# Patient Record
Sex: Male | Born: 1990 | Race: White | Hispanic: No | Marital: Single | Smoking: Heavy tobacco smoker
Health system: Southern US, Community
[De-identification: ages and names within clinical notes are randomized; demographics above are authoritative.]

---
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
## 2016-11-19 DIAGNOSIS — Y999 Unspecified external cause status: Secondary | ICD-10-CM | POA: Insufficient documentation

## 2016-11-19 DIAGNOSIS — Y9372 Activity, wrestling: Secondary | ICD-10-CM | POA: Insufficient documentation

## 2016-11-19 DIAGNOSIS — W228XXA Striking against or struck by other objects, initial encounter: Secondary | ICD-10-CM | POA: Insufficient documentation

## 2016-11-19 DIAGNOSIS — Y929 Unspecified place or not applicable: Secondary | ICD-10-CM | POA: Insufficient documentation

## 2016-11-19 DIAGNOSIS — S52002A Unspecified fracture of upper end of left ulna, initial encounter for closed fracture: Secondary | ICD-10-CM | POA: Insufficient documentation

## 2016-11-19 DIAGNOSIS — F17210 Nicotine dependence, cigarettes, uncomplicated: Secondary | ICD-10-CM | POA: Insufficient documentation

## 2016-11-20 ENCOUNTER — Encounter (HOSPITAL_COMMUNITY): Payer: Self-pay

## 2016-11-20 ENCOUNTER — Emergency Department (HOSPITAL_COMMUNITY): Payer: Self-pay

## 2016-11-20 ENCOUNTER — Emergency Department (HOSPITAL_COMMUNITY)
Admission: EM | Admit: 2016-11-20 | Discharge: 2016-11-20 | Disposition: A | Discharge status: Home / Self Care | Payer: Self-pay | Attending: Emergency Medicine | Admitting: Emergency Medicine

## 2016-11-20 DIAGNOSIS — S52002A Unspecified fracture of upper end of left ulna, initial encounter for closed fracture: Secondary | ICD-10-CM

## 2016-11-20 MED ORDER — HYDROCODONE-ACETAMINOPHEN 5-325 MG PO TABS: 1.0000 | ORAL_TABLET | ORAL | 0 refills | Status: AC | PRN

## 2016-11-20 MED ORDER — OXYCODONE-ACETAMINOPHEN 5-325 MG PO TABS
1.0000 | ORAL_TABLET | ORAL | Status: DC | PRN
  Administered 2016-11-20: 1 via ORAL

## 2016-11-20 MED ORDER — OXYCODONE-ACETAMINOPHEN 5-325 MG PO TABS
ORAL_TABLET | ORAL | Status: AC
  Filled 2016-11-20: qty 1

## 2016-11-20 NOTE — ED Triage Notes (Signed)
Pt. Was wrestling with a friend and friend slammed him on the ground and he heard his left arm pop out of joint.

## 2016-11-20 NOTE — ED Provider Notes (Signed)
[facility]-EMERGENCY DEPT Provider Note   CSN: [id number] Arrival date & time: 11/19/16  2357     History   Chief Complaint Chief Complaint  Patient presents with  . Arm Injury    HPI Dustin Brewer is a 26 y.o. male.  Patient presents for evaluation of arm injury. Patient reports that he was wrestling with a friend and was slammed to the ground. Patient heard a pop and felt immediate pain in the area of the left elbow. No other injury.      History reviewed. No pertinent past medical history.  There are no active problems to display for this patient.   History reviewed. No pertinent surgical history.     Home Medications    Prior to Admission medications   Not on File    Family History History reviewed. No pertinent family history.  Social History Social History  Substance Use Topics  . Smoking status: Heavy Tobacco Smoker    Packs/day: 1.00    Types: Cigarettes  . Smokeless tobacco: Never Used  . Alcohol use Not on file     Allergies   Patient has no allergy information on record.   Review of Systems Review of Systems  Musculoskeletal:       Arm pain  All other systems reviewed and are negative.    Physical Exam Updated Vital Signs BP 111/66 (BP Location: Right Arm)   Pulse 97   Temp 97.7 F (36.5 C) (Oral)   Ht  (1.803 m)   Wt 70.3 kg (155 lb)   SpO2 99%   BMI 21.62 kg/m   Physical Exam  Constitutional: He appears well-developed and well-nourished.  HENT:  Head: Atraumatic.  Eyes: EOM are normal.  Cardiovascular:  Pulses:      Radial pulses are 2+ on the left side.  Pulmonary/Chest: Effort normal.  Musculoskeletal:       Left forearm: He exhibits tenderness and swelling.  Skin: Skin is warm, dry and intact.     ED Treatments / Results  Labs (all labs ordered are listed, but only abnormal results are displayed) Labs Reviewed - No data to display  EKG  EKG Interpretation None       Radiology Dg Elbow 2 Views  Left  Result Date: 11/20/2016 CLINICAL DATA:  Dislocation. Left elbow pain after injury, felt a pop. EXAM: LEFT ELBOW - 2 VIEW COMPARISON:  None. FINDINGS: Proximal ulnar shaft fracture. Elbow alignment is grossly maintained. No evidence of elbow joint effusion. Soft tissue edema at the fracture site. IMPRESSION: Proximal ulnar shaft fracture. No evidence of additional fracture of the elbow. No dislocation. Electronically Signed   By: Rubye Oaks M.D.   On: 11/20/2016 02:13   Dg Forearm Left  Result Date: 11/20/2016 CLINICAL DATA:  Left forearm pain after injury. EXAM: LEFT FOREARM - 2 VIEW COMPARISON:  None. FINDINGS: Nondisplaced minimally comminuted mid proximal ulnar shaft fracture. No significant angulation. The radius is intact. There is soft tissue edema at the fracture site. Wrist alignment is maintained. IMPRESSION: Nondisplaced minimally comminuted mid proximal ulnar shaft fracture. Electronically Signed   By: Rubye Oaks M.D.   On: 11/20/2016 02:16    Procedures Procedures (including critical care time)  Medications Ordered in ED Medications  oxyCODONE-acetaminophen (PERCOCET/ROXICET) 5-325 MG per tablet 1 tablet (1 tablet Oral Given 11/20/16 0036)     Initial Impression / Assessment and Plan / ED Course  I have reviewed the triage vital signs and the nursing notes.  Pertinent labs &  imaging results that were available during my care of the patient were reviewed by me and considered in my medical decision making (see chart for details).     Patient presents to the ER for evaluation of left arm injury. X-ray shows proximal ulna fracture. No other injury noted. Patient is neurovascularly intact. Discussed with Dr. Janee Morn, will place in sugar tong splint. Dr. Janee Morn will see in the ER.  Final Clinical Impressions(s) / ED Diagnoses   Final diagnoses:  Closed fracture of proximal end of left ulna, unspecified fracture morphology, initial encounter    New  Prescriptions New Prescriptions   No medications on file     Gilda Crease, MD 11/20/16 [phone]

## 2016-11-20 NOTE — Consult Note (Signed)
ORTHOPAEDIC CONSULTATION HISTORY & PHYSICAL REQUESTING PHYSICIAN: Gilda Crease, *  Chief Complaint: left forearm pain  HPI: Dustin Brewer is a 26 y.o. male who reports horse playing with a friend, when he was thrown to the ground.  He had the onset of pain in the proximal aspect of the forearm without deformity, and presented to the emergency department for evaluation.  X-rays admit obtained and consultation made.  History reviewed. No pertinent past medical history. History reviewed. No pertinent surgical history. Social History   Social History  . Marital status: Single    Spouse name: N/A  . Number of children: N/A  . Years of education: N/A   Social History Main Topics  . Smoking status: Heavy Tobacco Smoker    Packs/day: 1.00    Types: Cigarettes  . Smokeless tobacco: Never Used  . Alcohol use None  . Drug use: Unknown  . Sexual activity: Not Asked   Other Topics Concern  . None   Social History Narrative  . None   History reviewed. No pertinent family history. Not on File Prior to Admission medications   Medication Sig Start Date End Date Taking? Authorizing Provider  HYDROcodone-acetaminophen (NORCO/VICODIN) 5-325 MG tablet Take 1 tablet by mouth every 4 (four) hours as needed for moderate pain. 11/20/16   Gilda Crease, MD   Dg Elbow 2 Views Left  Result Date: 11/20/2016 CLINICAL DATA:  Dislocation. Left elbow pain after injury, felt a pop. EXAM: LEFT ELBOW - 2 VIEW COMPARISON:  None. FINDINGS: Proximal ulnar shaft fracture. Elbow alignment is grossly maintained. No evidence of elbow joint effusion. Soft tissue edema at the fracture site. IMPRESSION: Proximal ulnar shaft fracture. No evidence of additional fracture of the elbow. No dislocation. Electronically Signed   By: Rubye Oaks M.D.   On: 11/20/2016 02:13   Dg Forearm Left  Result Date: 11/20/2016 CLINICAL DATA:  Left forearm pain after injury. EXAM: LEFT FOREARM - 2 VIEW  COMPARISON:  None. FINDINGS: Nondisplaced minimally comminuted mid proximal ulnar shaft fracture. No significant angulation. The radius is intact. There is soft tissue edema at the fracture site. Wrist alignment is maintained. IMPRESSION: Nondisplaced minimally comminuted mid proximal ulnar shaft fracture. Electronically Signed   By: Rubye Oaks M.D.   On: 11/20/2016 02:16    Positive ROS: All other systems have been reviewed and were otherwise negative with the exception of those mentioned in the HPI and as above.  Physical Exam: Vitals: Refer to EMR. Constitutional:  WD, WN, NAD HEENT:  NCAT, EOMI Neuro/Psych:  Alert & oriented to person, place, and time; appropriate mood & affect Lymphatic: No generalized extremity edema or lymphadenopathy Extremities / MSK:  The extremities are normal with respect to appearance, ranges of motion, joint stability, muscle strength/tone, sensation, & perfusion except as otherwise noted:  Left upper extremity is presently in a sugar tong splint.  Fingers are warm with brisk capillary refill, intact light touch sensibility in the radial, median, and ulnar nerve distributions with intact motor to the same.  Tenderness at the proximal one third of the forearm, particularly at the subcutaneous border of the ulna  Assessment: Relatively nondisplaced, non-angulated left ulna fracture at the junction of the middle and proximal one thirds, with what appears to be well reduced radiocapitellar joint.  Plan: I discussed these findings with him.  I really encouraged him to keep the splint in place and to engage in nothing heavier paper/pencil tasks with the left hand to help prevent displacement of the  fracture.  If it doesn't displace, his well-suited for nonoperative treatment.  We will have him back in 7-10 days, at which time he should get new x-rays of the left forearm in the splint.  Cliffton Asters Janee Morn, MD      Orthopaedic & Hand Surgery [facility] Orthopaedic &  Sports Medicine [facility] [address] [place], [place]  [address] Office: [phone] Mobile: [phone]  11/20/2016, 6:15 AM

## 2018-06-02 IMAGING — DX DG ELBOW 2V*L*
3 series · 3 of 3 positions shown · non-contrast
Comparison: None.

CLINICAL DATA: Dislocation. Left elbow pain after injury, felt a
pop.

EXAM:
LEFT ELBOW - 2 VIEW

[elbow ap (1 of 2)]
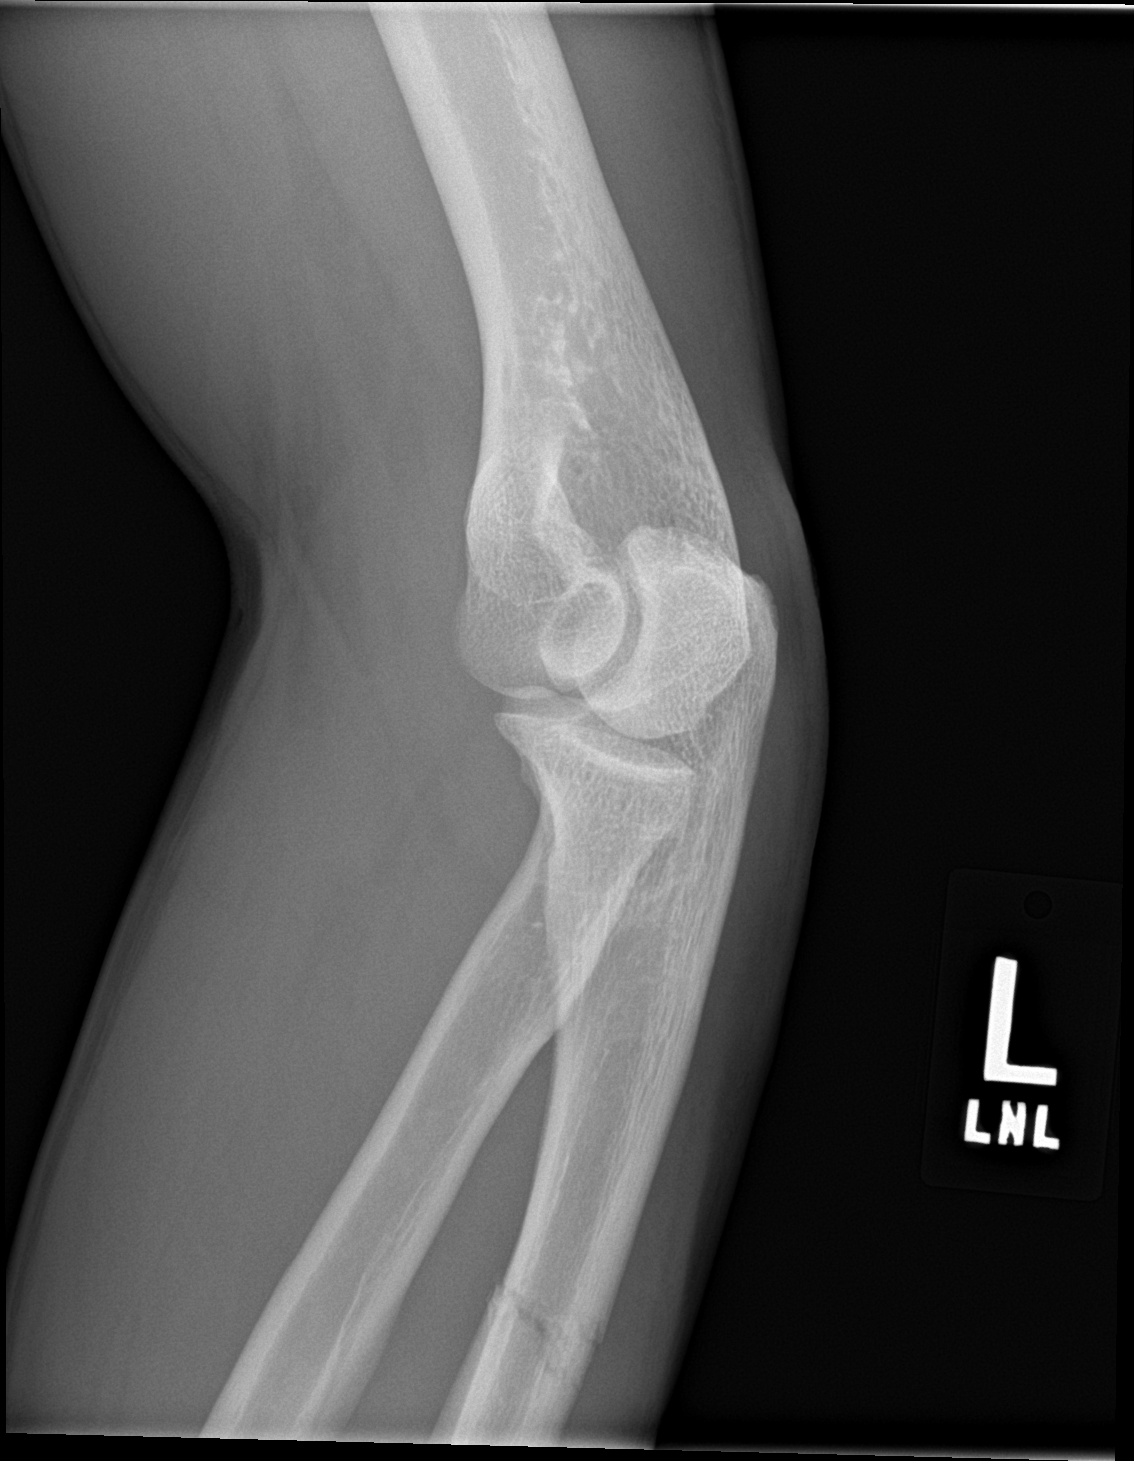

[elbow lat]
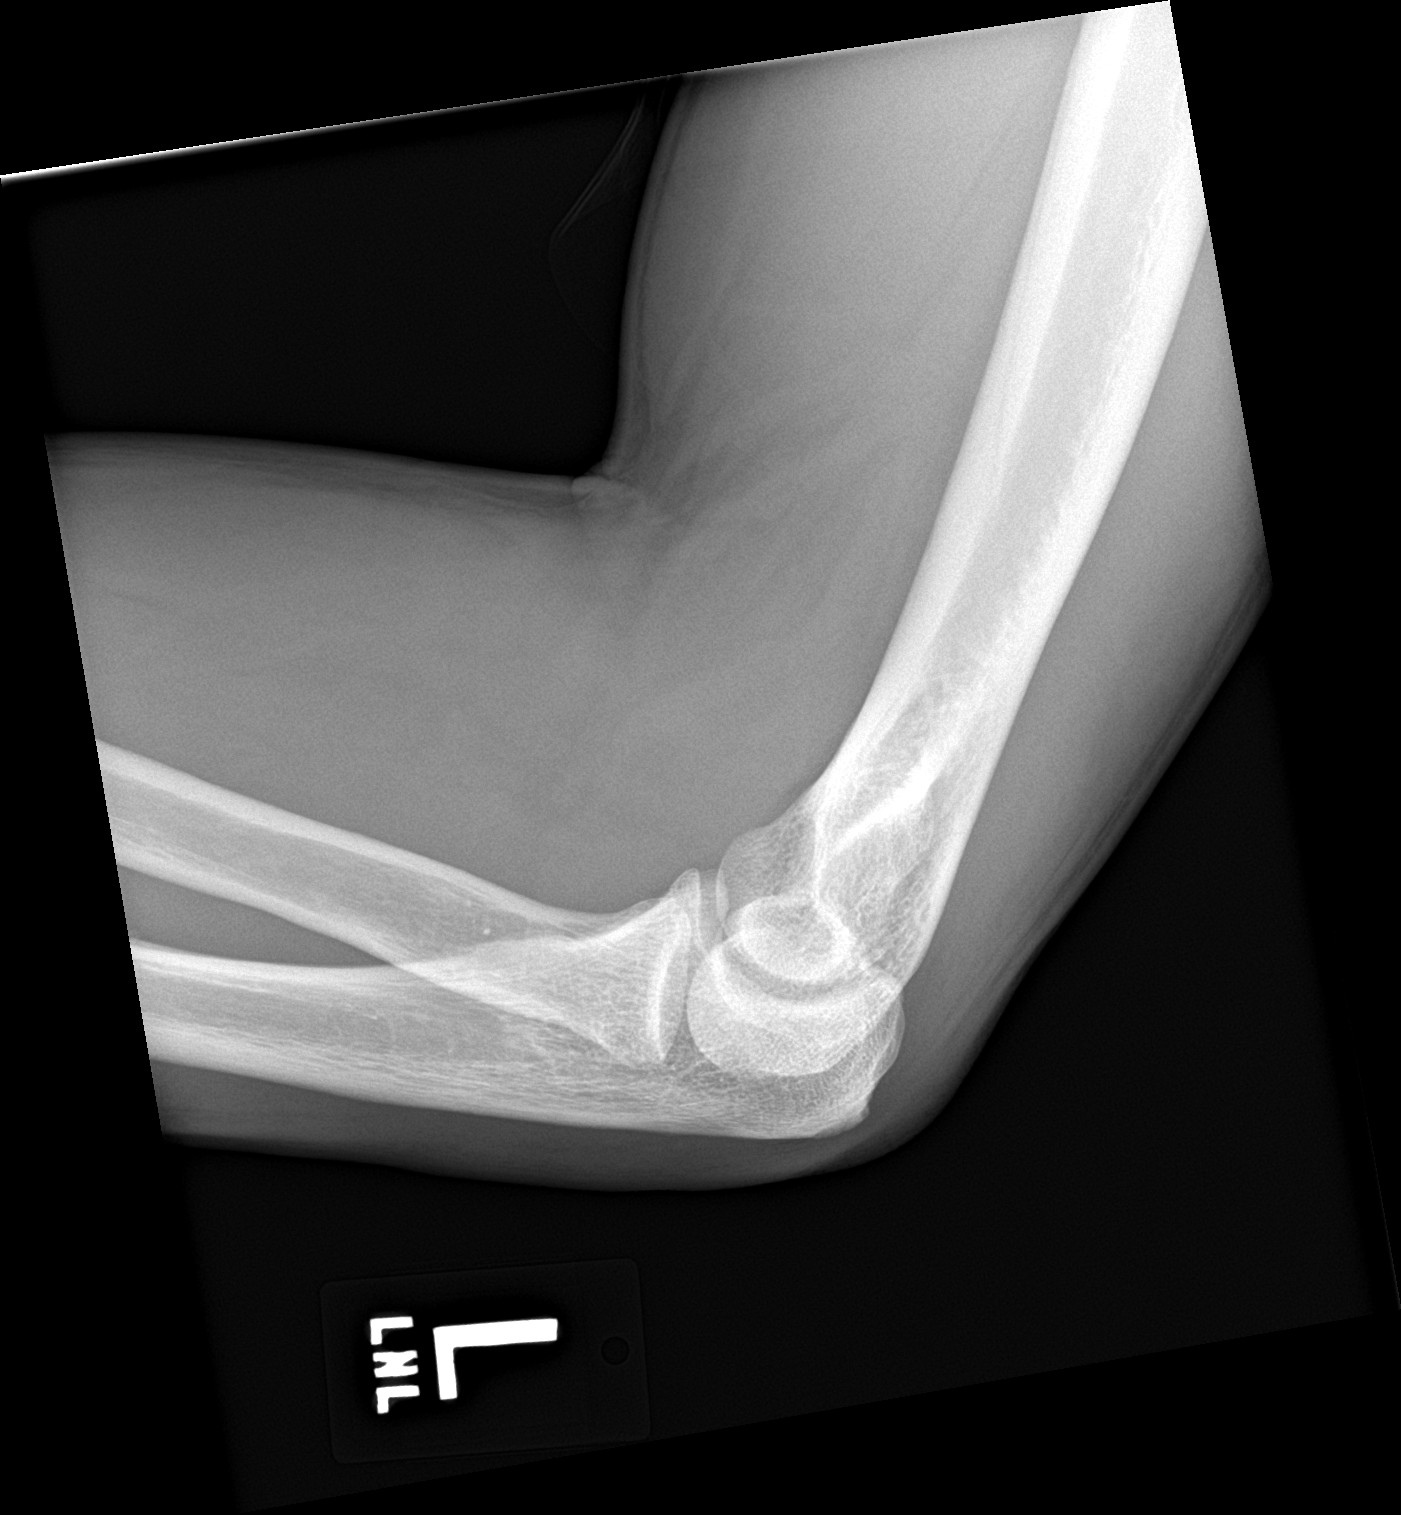

[elbow ap (2 of 2)]
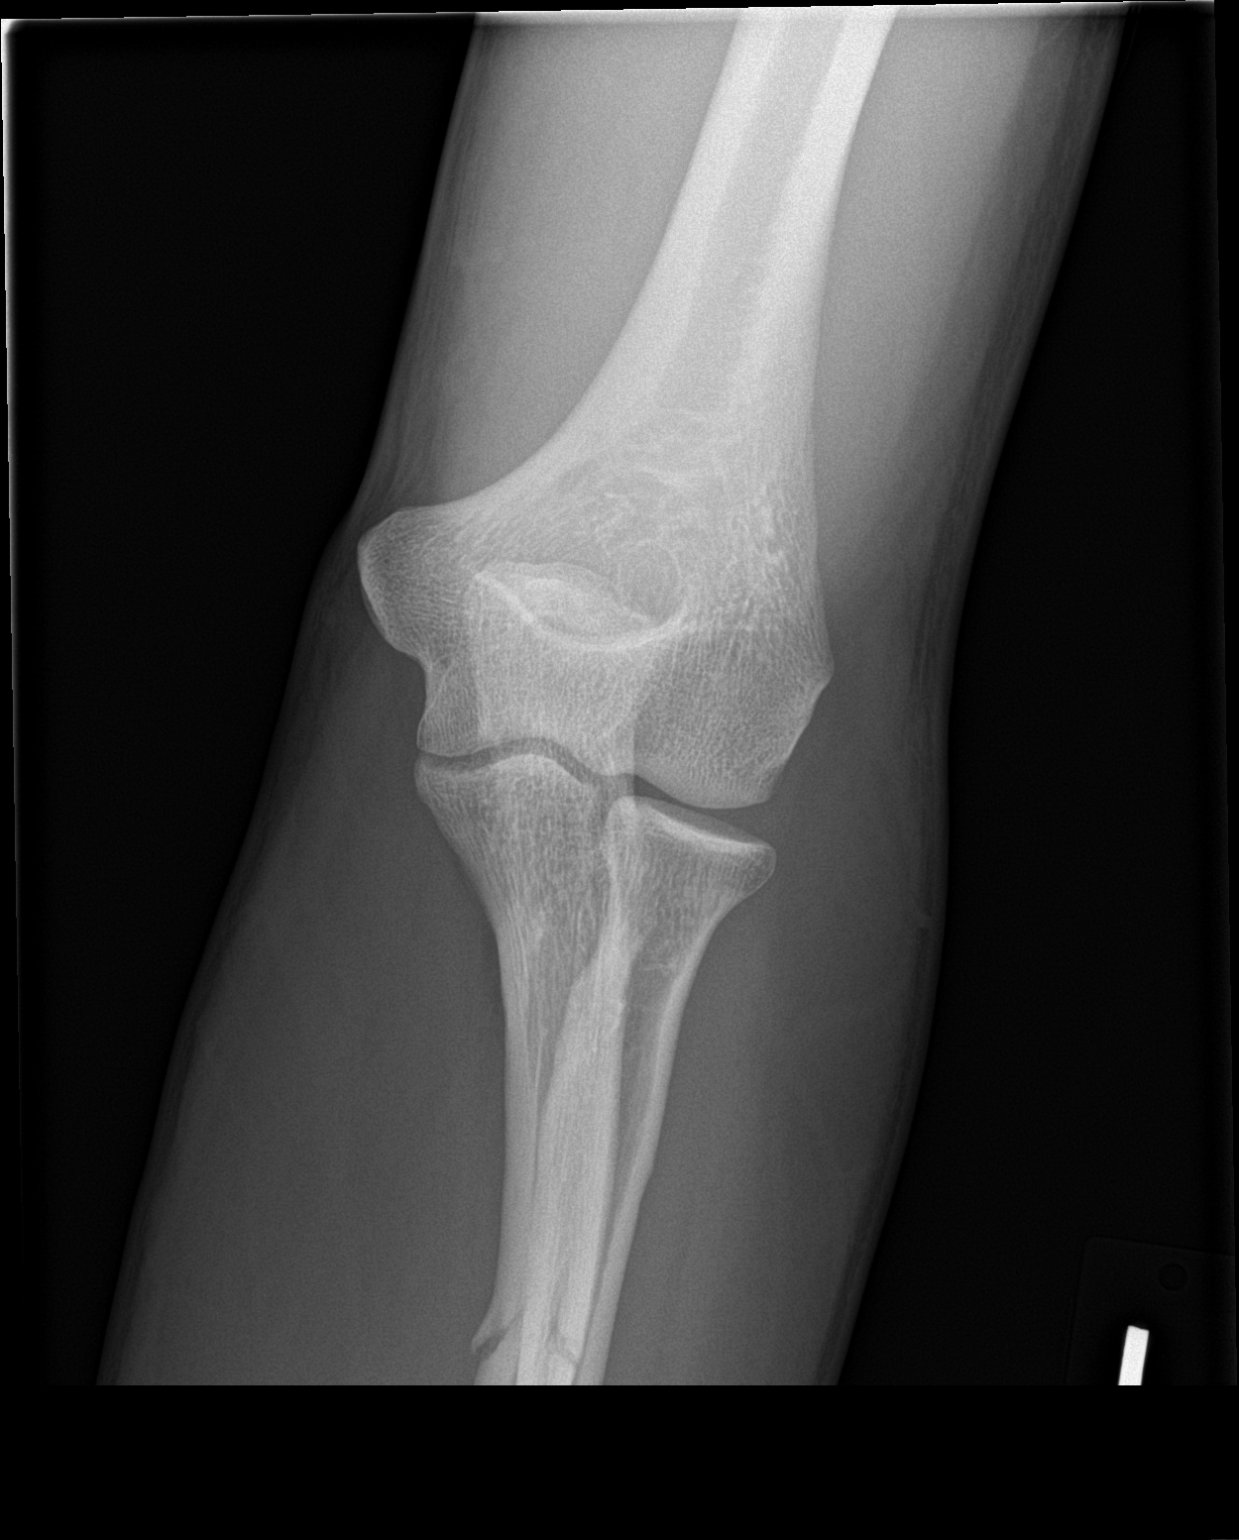

[3 of 3 positions shown; findings below may reference images not displayed]

FINDINGS: Proximal ulnar shaft fracture. Elbow alignment is grossly
maintained. No evidence of elbow joint effusion. Soft tissue edema
at the fracture site.
IMPRESSION: Proximal ulnar shaft fracture. No evidence of additional fracture of
the elbow. No dislocation.

## 2018-06-02 IMAGING — DX DG FOREARM 2V*L*
3 series · 3 of 3 positions shown · non-contrast
Comparison: None.

CLINICAL DATA: Left forearm pain after injury.

EXAM:
LEFT FOREARM - 2 VIEW

[forearm ap (1 of 2)]
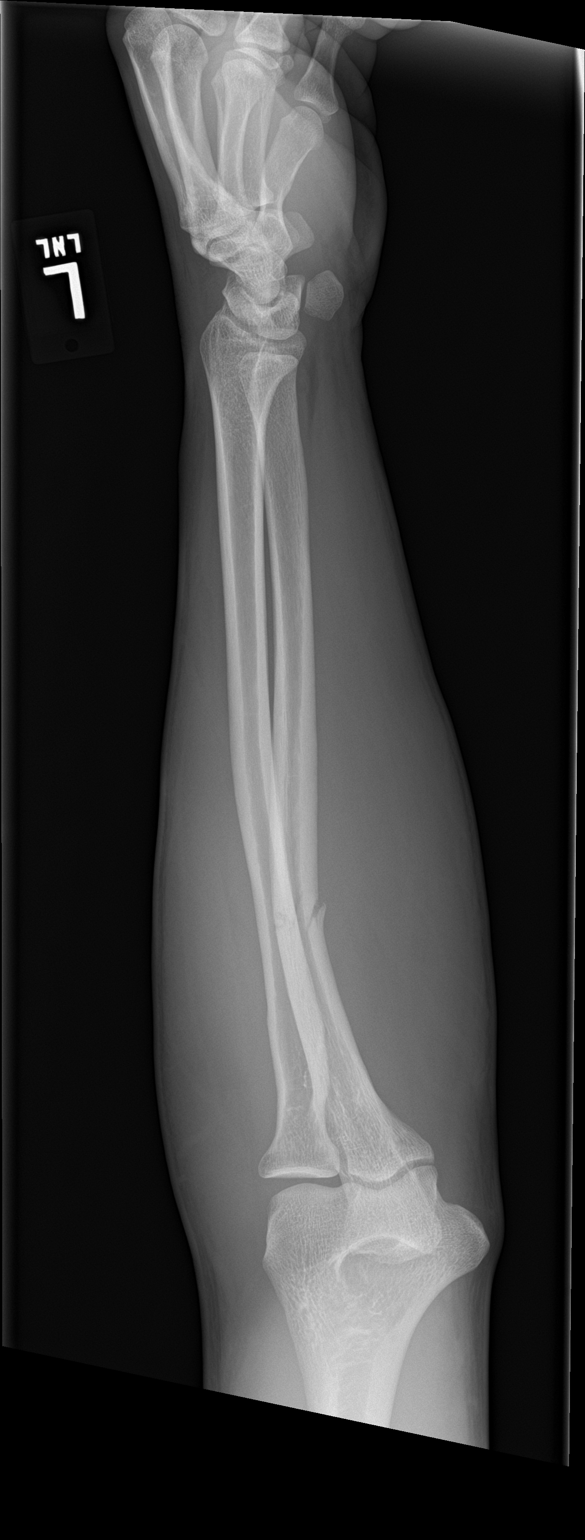

[forearm lat]
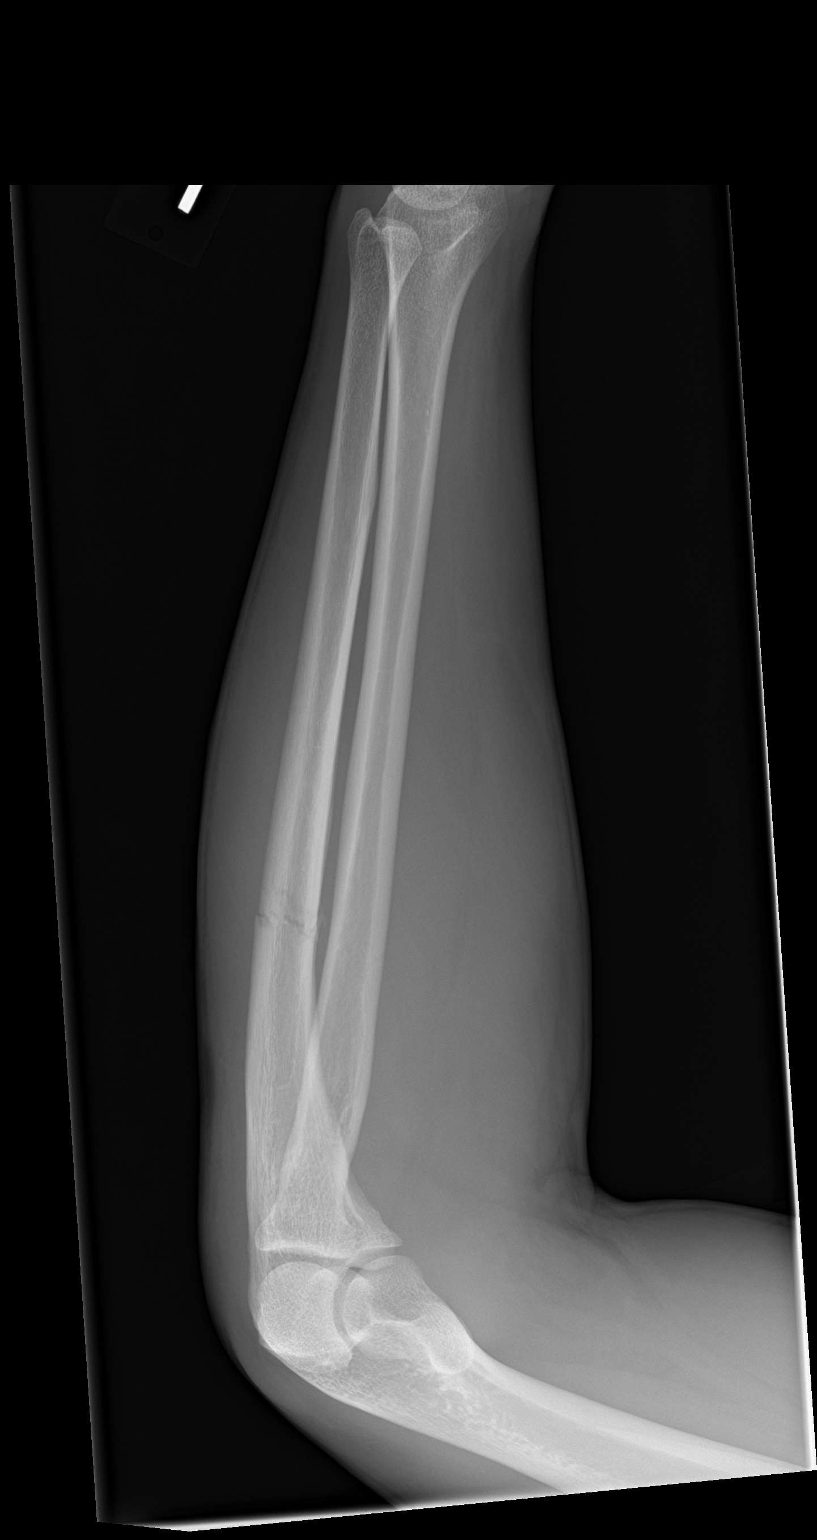

[forearm ap (2 of 2)]
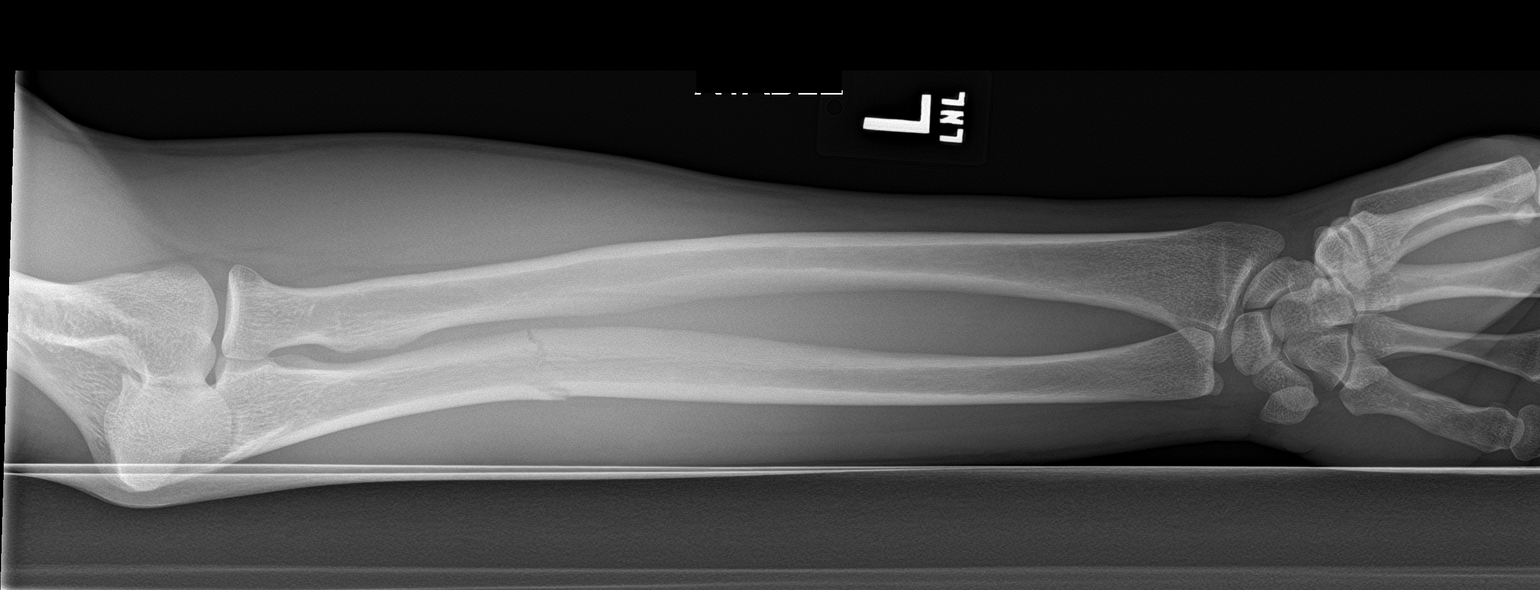

[3 of 3 positions shown; findings below may reference images not displayed]

FINDINGS: Nondisplaced minimally comminuted mid proximal ulnar shaft fracture.
No significant angulation. The radius is intact. There is soft
tissue edema at the fracture site. Wrist alignment is maintained.
IMPRESSION: Nondisplaced minimally comminuted mid proximal ulnar shaft fracture.

## 2021-07-19 ENCOUNTER — Emergency Department (HOSPITAL_COMMUNITY): Payer: BLUE CROSS/BLUE SHIELD

## 2021-07-19 ENCOUNTER — Emergency Department (HOSPITAL_COMMUNITY)
Admission: EM | Admit: 2021-07-19 | Discharge: 2021-07-19 | Disposition: A | Discharge status: Home / Self Care | Payer: BLUE CROSS/BLUE SHIELD | Attending: Emergency Medicine | Admitting: Emergency Medicine

## 2021-07-19 ENCOUNTER — Other Ambulatory Visit: Payer: Self-pay

## 2021-07-19 DIAGNOSIS — J0100 Acute maxillary sinusitis, unspecified: Secondary | ICD-10-CM | POA: Insufficient documentation

## 2021-07-19 DIAGNOSIS — R0981 Nasal congestion: Secondary | ICD-10-CM | POA: Diagnosis present

## 2021-07-19 DIAGNOSIS — Z20822 Contact with and (suspected) exposure to covid-19: Secondary | ICD-10-CM | POA: Insufficient documentation

## 2021-07-19 LAB — RESP PANEL BY RT-PCR (FLU A&B, COVID) ARPGX2
Influenza A by PCR: NEGATIVE
Influenza B by PCR: NEGATIVE
SARS Coronavirus 2 by RT PCR: NEGATIVE

## 2021-07-19 MED ORDER — AMOXICILLIN-POT CLAVULANATE 875-125 MG PO TABS: 1.0000 | ORAL_TABLET | Freq: Two times a day (BID) | ORAL | 0 refills | Status: AC

## 2021-07-19 NOTE — ED Triage Notes (Signed)
Pt from home for eval of one week of subjective fever, nasal congestion, and productive cough. Symptoms are all improving but would still like eval. Drinking cold [employer] drink in triage so unable to accurately assess temperature.

## 2021-07-19 NOTE — ED Provider Notes (Signed)
[facility] EMERGENCY DEPARTMENT Provider Note   CSN: [id number] Arrival date & time: 07/19/21  1157     History  No chief complaint on file.   Dustin Brewer is a 31 y.o. male presents to the ED for evaluation of upper respiratory symptoms and sinus congestion that been going on about 11 days.  Patient endorses subjective fevers not improved with Tylenol Motrin.  He has had productive cough with dark green sputum although he notes that this seems to have improved.  Also endorses sore throat which is also slightly improving.  However, he notes that his left maxillary sinus has been progressively worsening and is quite tender to the touch.  Denies chest pain, shortness of breath no other treatment prior to arrival.  No previous history of sinusitis.  HPI     Home Medications Prior to Admission medications   Medication Sig Start Date End Date Taking? Authorizing Provider  amoxicillin-clavulanate (AUGMENTIN) 875-125 MG tablet Take 1 tablet by mouth every 12 (twelve) hours for 5 days. 07/19/21 07/24/21 Yes Janell Quiet, PA-C  HYDROcodone-acetaminophen (NORCO/VICODIN) 5-325 MG tablet Take 1 tablet by mouth every 4 (four) hours as needed for moderate pain. 11/20/16   Gilda Crease, MD      Allergies    Patient has no known allergies.    Review of Systems   Review of Systems  Physical Exam Updated Vital Signs BP 129/83 (BP Location: Right Arm)   Pulse 81   Temp 98.3 F (36.8 C) (Oral)   Resp 16   SpO2 100%  Physical Exam Vitals and nursing note reviewed.  Constitutional:      General: He is not in acute distress.    Appearance: He is not ill-appearing.  HENT:     Head: Atraumatic.     Nose:     Right Sinus: No maxillary sinus tenderness or frontal sinus tenderness.     Left Sinus: Maxillary sinus tenderness present. No frontal sinus tenderness.     Mouth/Throat:     Comments: Mild oropharynx erythema Eyes:     Conjunctiva/sclera: Conjunctivae  normal.  Cardiovascular:     Rate and Rhythm: Normal rate and regular rhythm.     Pulses: Normal pulses.     Heart sounds: No murmur heard. Pulmonary:     Effort: Pulmonary effort is normal. No respiratory distress.     Breath sounds: Normal breath sounds.  Abdominal:     General: Abdomen is flat. There is no distension.     Palpations: Abdomen is soft.     Tenderness: There is no abdominal tenderness.  Musculoskeletal:        General: Normal range of motion.     Cervical back: Normal range of motion.  Skin:    General: Skin is warm and dry.     Capillary Refill: Capillary refill takes less than 2 seconds.  Neurological:     General: No focal deficit present.     Mental Status: He is alert.  Psychiatric:        Mood and Affect: Mood normal.    ED Results / Procedures / Treatments   Labs (all labs ordered are listed, but only abnormal results are displayed) Labs Reviewed  RESP PANEL BY RT-PCR (FLU A&B, COVID) [id number]    EKG None  Radiology DG Chest 2 View  Result Date: 07/19/2021 CLINICAL DATA:  Productive cough for 11 days.  Fever. EXAM: CHEST - 2 VIEW COMPARISON:  None Available. FINDINGS: The heart size  and mediastinal contours are within normal limits. Both lungs are clear. The visualized skeletal structures are unremarkable. IMPRESSION: No active cardiopulmonary disease. Electronically Signed   By: Danae Orleans M.D.   On: 07/19/2021 10:46    Procedures Procedures    Medications Ordered in ED Medications - No data to display  ED Course/ Medical Decision Making/ A&P                           Medical Decision Making Amount and/or Complexity of Data Reviewed Radiology: ordered.  Risk Prescription drug management.   31 year old male in no acute distress, nontoxic-appearing presents to the ED for evaluation of upper respiratory and left maxillary sinus pain for about 11 days.  Differentials include viral URI, pneumonia, sinusitis.  Vitals without significant  abnormality.  Lungs CTA bilaterally.  He does have significant tenderness over the left maxillary sinus.  Given that he has had a productive cough although reports this slightly improving, I obtained chest x-ray which was shows no evidence of pneumonia or infection.  I viewed and interpreted this myself and agree with radiologist interpretation.  Given lingering tenderness over the left maxillary question for 11 days, will treat with 5-day course of Augmentin for sinusitis.  Encouraged use of Mucinex.  Patient expresses understanding and is amenable to plan.  Discharged home in good condition. Final Clinical Impression(s) / ED Diagnoses Final diagnoses:  Acute non-recurrent maxillary sinusitis    Rx / DC Orders ED Discharge Orders          Ordered    amoxicillin-clavulanate (AUGMENTIN) 875-125 MG tablet  Every 12 hours        07/19/21 1058              Janell Quiet, [place] 07/19/21 1105    Cheryll Cockayne, MD 07/31/21 1651

## 2021-07-19 NOTE — Discharge Instructions (Addendum)
Your COVID and flu was negative and your chest x-ray was normal.  Continue to take Mucinex to help with congestion, however since you have been having symptoms for nearly 2 weeks, I will prescribe a short course of antibiotics that you will take twice daily for 5 days.  Feel better soon

## 2021-09-18 ENCOUNTER — Ambulatory Visit
Admission: EM | Admit: 2021-09-18 | Discharge: 2021-09-18 | Disposition: A | Discharge status: Home / Self Care | Payer: BC Managed Care – PPO | Attending: Family Medicine | Admitting: Family Medicine

## 2021-09-18 DIAGNOSIS — T1502XA Foreign body in cornea, left eye, initial encounter: Secondary | ICD-10-CM

## 2021-09-18 MED ORDER — TETRACAINE HCL 0.5 % OP SOLN
1.0000 [drp] | Freq: Once | OPHTHALMIC | Status: AC
  Administered 2021-09-18: 1 [drp] via OPHTHALMIC

## 2021-09-18 MED ORDER — FLUORESCEIN SODIUM 1 MG OP STRP
1.0000 | ORAL_STRIP | Freq: Once | OPHTHALMIC | Status: AC
  Administered 2021-09-18: 1 via OPHTHALMIC

## 2021-09-18 NOTE — ED Triage Notes (Signed)
Pt presents to [facility] with co of L eye irritation and redness since yesterday, pt st it started before work yesterday didn't really bother him until he ws driving home. Pt st no drainage., but feels like something is in his eye with nothing that he can remember getting in eye

## 2021-09-20 NOTE — ED Provider Notes (Signed)
  [facility]   [id number] 09/18/21 Arrival Time: 1100  ASSESSMENT & PLAN:  1. Foreign body of left cornea, initial encounter    Unable to remove punctate corneal FB. Discussed with Dr Dione Booze. He will see pt at his office within the hour. Pt to drive there now.  Meds ordered this encounter  Medications   fluorescein ophthalmic strip 1 strip   tetracaine (PONTOCAINE) 0.5 % ophthalmic solution 1 drop    Reviewed expectations re: course of current medical issues. Questions answered. Outlined signs and symptoms indicating need for more acute intervention. Patient verbalized understanding. After Visit Summary given.   SUBJECTIVE:  Dustin Brewer is a 31 y.o. male who presents with complaint of  LEFT eye irritation and redness; noted yesterday. Ques FB in eye. Injury: no. Visual changes: no. Contact lens use: no. Recent illness: no. Self treatment: none PTA.   OBJECTIVE:  Vitals:   09/18/21 1142  BP: 113/72  Pulse: 71  Resp: 18  Temp: 98.4 F (36.9 C)  SpO2: 98%    General appearance: alert; no distress HEENT: [place]; AT; PERRLA; no restriction of the extraocular movements OS: with mild reported pain; without conjunctival injection; without drainage; without corneal opacities; without limbal flush; without periorbital swelling or erythema; fluorescein with punctate uptake over mid lower cornea Skin: warm and dry Psychological: alert and cooperative; normal mood and affect   No Known Allergies  History reviewed. No pertinent past medical history. Social History   Socioeconomic History   Marital status: Single    Spouse name: Not on file   Number of children: Not on file   Years of education: Not on file   Highest education level: Not on file  Occupational History   Not on file  Tobacco Use   Smoking status: Heavy Smoker    Packs/day: 1.00    Types: Cigarettes   Smokeless tobacco: Never  Substance and Sexual Activity   Alcohol use: Not on file    Drug use: Not on file   Sexual activity: Not on file  Other Topics Concern   Not on file  Social History Narrative   Not on file   Social Determinants of Health   Financial Resource Strain: Not on file  Food Insecurity: Not on file  Transportation Needs: Not on file  Physical Activity: Not on file  Stress: Not on file  Social Connections: Not on file  Intimate Partner Violence: Not on file   History reviewed. No pertinent family history. History reviewed. No pertinent surgical history.    Mardella Layman, MD 09/20/21 1227

## 2023-01-29 IMAGING — CR DG CHEST 2V
2 series · 2 of 2 positions shown · non-contrast
Comparison: None Available.

CLINICAL DATA: Productive cough for 11 days.  Fever.

EXAM:
CHEST - 2 VIEW

[chest pa]
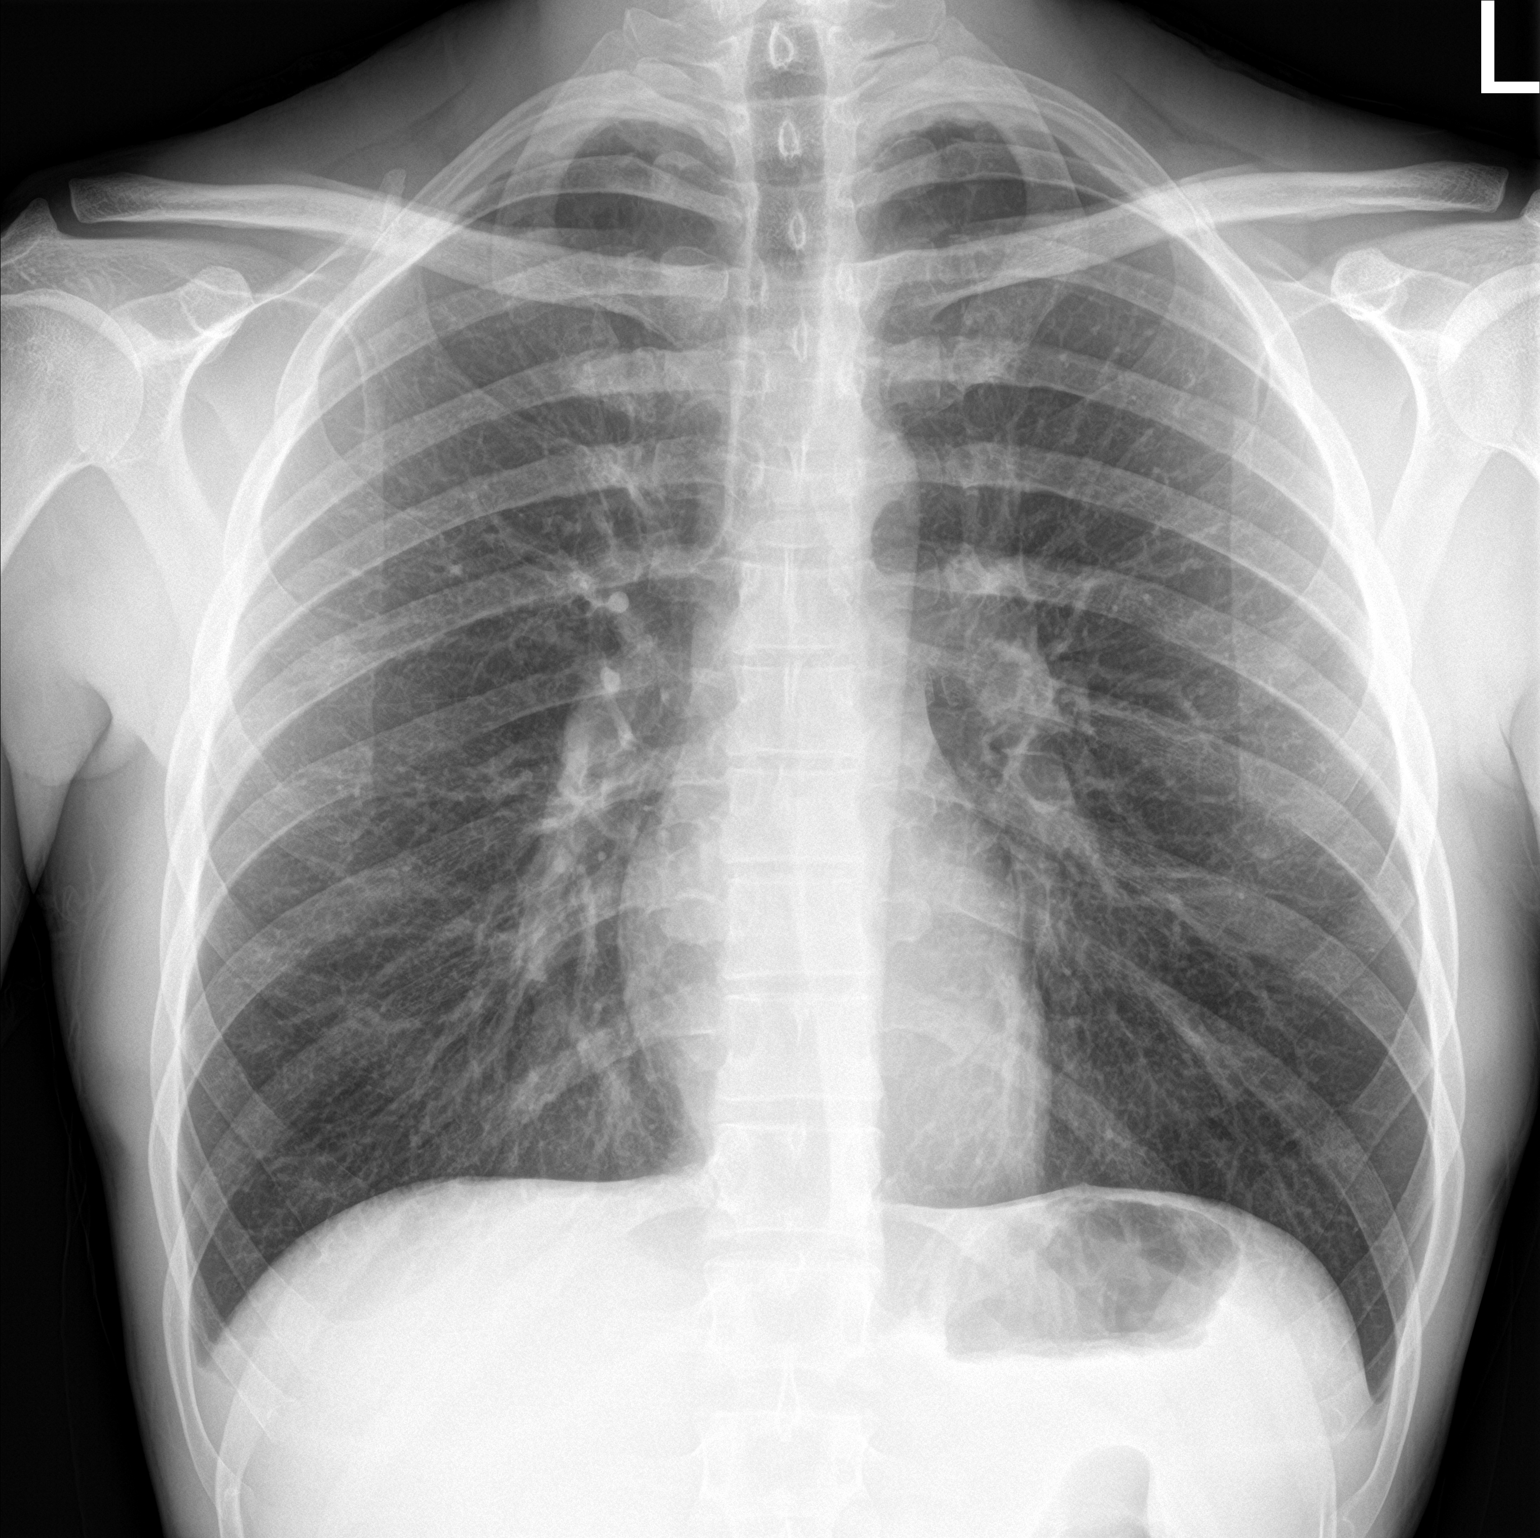

[chest lat]
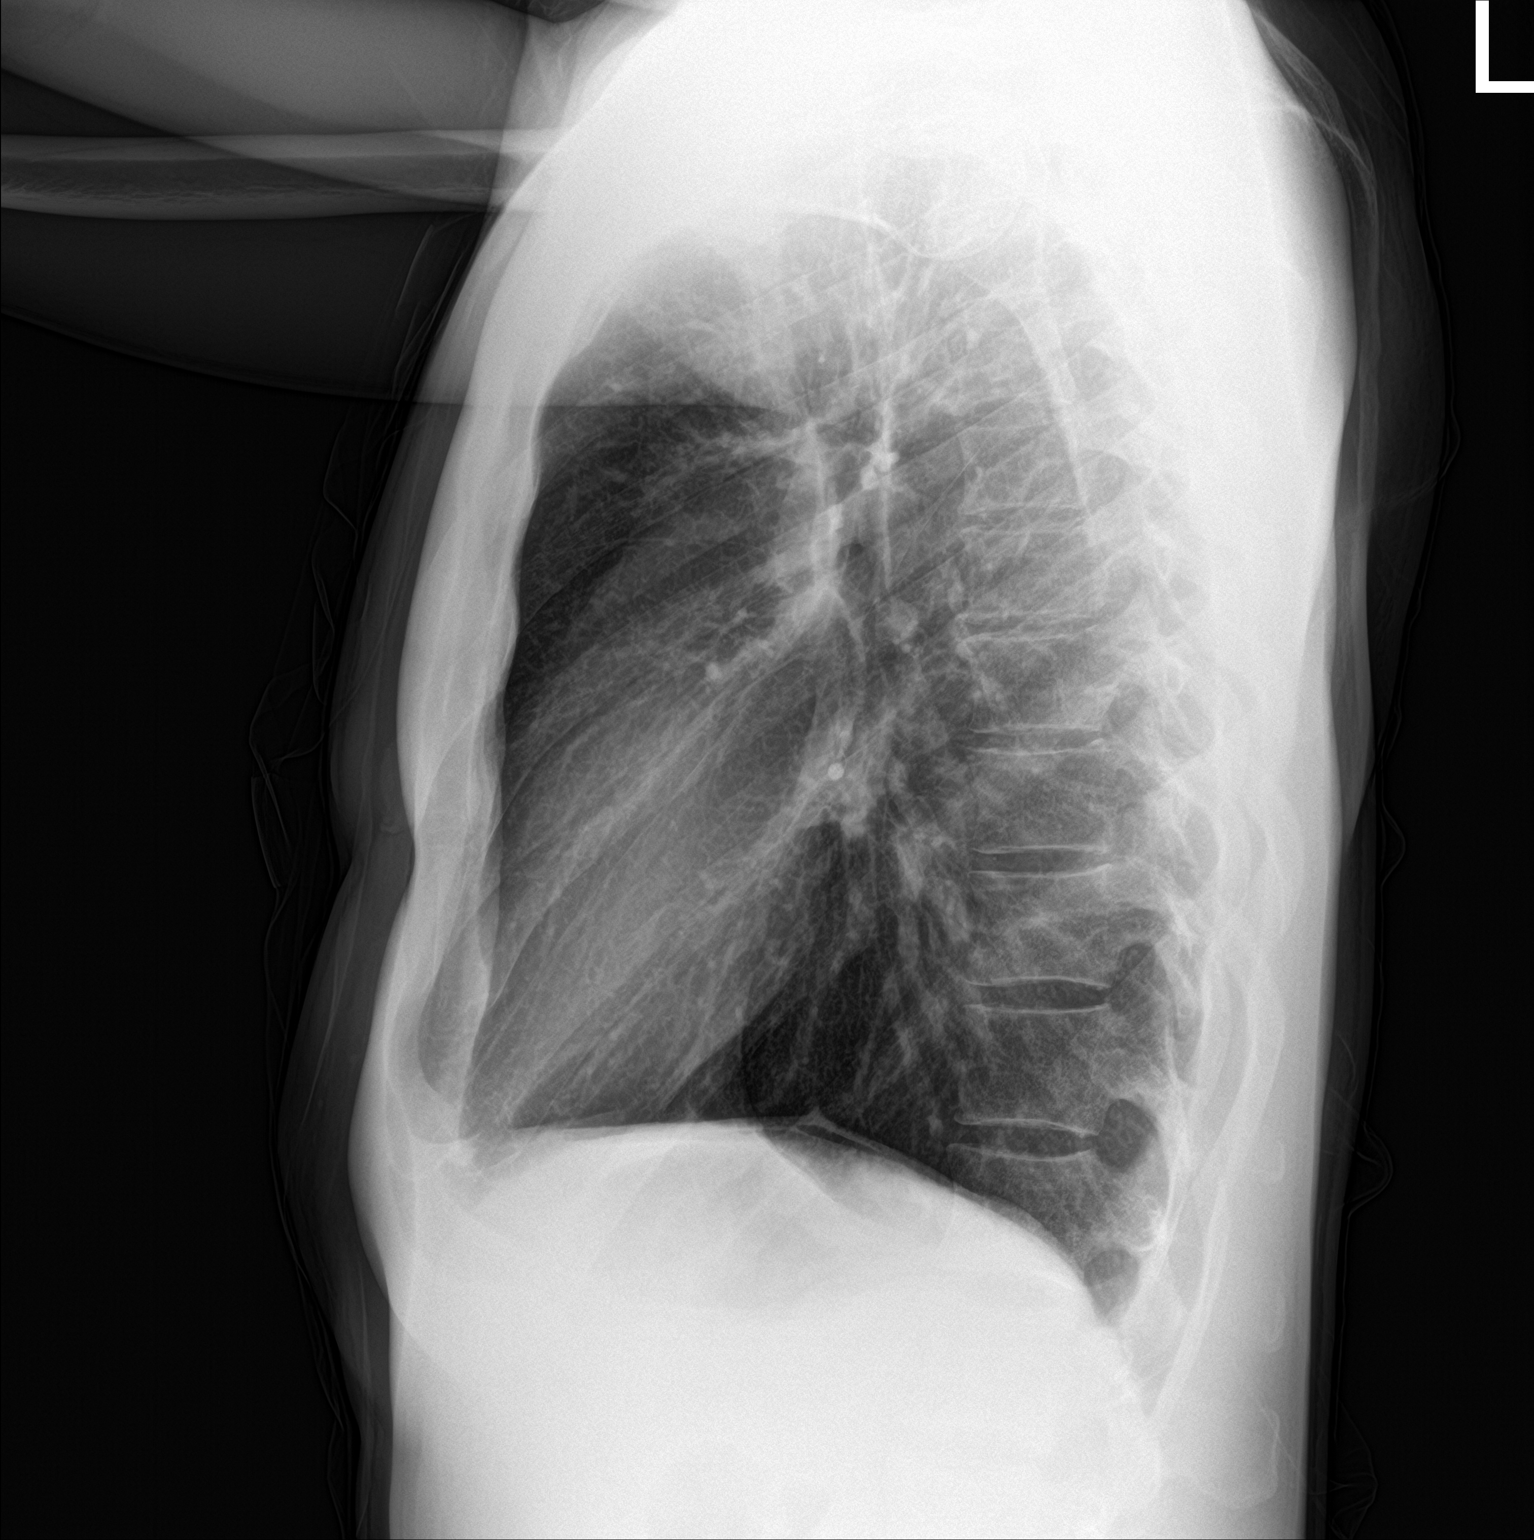

[2 of 2 positions shown; findings below may reference images not displayed]

FINDINGS: The heart size and mediastinal contours are within normal limits.
Both lungs are clear. The visualized skeletal structures are
unremarkable.
IMPRESSION: No active cardiopulmonary disease.
